# Patient Record
Sex: Female | Born: 1995 | Race: Black or African American | Hispanic: No | Marital: Single | Smoking: Never smoker
Health system: Southern US, Community
[De-identification: ages and names within clinical notes are randomized; demographics above are authoritative.]

---

## 2019-02-12 ENCOUNTER — Other Ambulatory Visit: Payer: Self-pay

## 2019-02-12 DIAGNOSIS — Z20822 Contact with and (suspected) exposure to covid-19: Secondary | ICD-10-CM

## 2019-02-13 LAB — NOVEL CORONAVIRUS, NAA: SARS-CoV-2, NAA: NOT DETECTED

## 2019-05-09 ENCOUNTER — Emergency Department
Admission: EM | Admit: 2019-05-09 | Discharge: 2019-05-09 | Disposition: A | Discharge status: Home / Self Care | Payer: Federal, State, Local not specified - PPO | Attending: Emergency Medicine | Admitting: Emergency Medicine

## 2019-05-09 ENCOUNTER — Encounter: Payer: Self-pay | Admitting: Emergency Medicine

## 2019-05-09 ENCOUNTER — Other Ambulatory Visit: Payer: Self-pay

## 2019-05-09 DIAGNOSIS — M5410 Radiculopathy, site unspecified: Secondary | ICD-10-CM | POA: Diagnosis not present

## 2019-05-09 DIAGNOSIS — M25561 Pain in right knee: Secondary | ICD-10-CM

## 2019-05-09 DIAGNOSIS — M79604 Pain in right leg: Secondary | ICD-10-CM | POA: Insufficient documentation

## 2019-05-09 DIAGNOSIS — R202 Paresthesia of skin: Secondary | ICD-10-CM | POA: Diagnosis not present

## 2019-05-09 MED ORDER — MELOXICAM 15 MG PO TABS: 15.0000 mg | ORAL_TABLET | Freq: Every day | ORAL | 2 refills | Status: AC

## 2019-05-09 NOTE — ED Notes (Signed)
See triage note, Pt states she started having numbness and tingling to right leg from knee down since 1900 today.  No heat or swelling noted.  Pt in NAD at this time.

## 2019-05-09 NOTE — ED Provider Notes (Signed)
[facility] Emergency Department Provider Note  ____________________________________________   First MD Initiated Contact with Patient 05/09/19 2215     (approximate)  I have reviewed the triage vital signs and the nursing notes.   HISTORY  Chief Complaint Leg Pain    HPI Hayley Roberts is a 24 y.o. female presents emergency department complaining of tingling in the right leg from the knee down since about 7 PM.  No known injury.  No pain in her calf.  No chest pain or shortness of breath.  States the tingling sensation happens mostly when she bears weight on the knee.    History reviewed. No pertinent past medical history.  There are no problems to display for this patient.   History reviewed. No pertinent surgical history.  Prior to Admission medications   Medication Sig Start Date End Date Taking? Authorizing Provider  meloxicam (MOBIC) 15 MG tablet Take 1 tablet (15 mg total) by mouth daily. 05/09/19 05/08/20  Faythe Ghee, PA-C    Allergies Patient has no known allergies.  History reviewed. No pertinent family history.  Social History Social History   Tobacco Use  . Smoking status: Never Smoker  . Smokeless tobacco: Never Used  Substance Use Topics  . Alcohol use: Not on file  . Drug use: Not on file    Review of Systems  Constitutional: No fever/chills Eyes: No visual changes. ENT: No sore throat. Respiratory: Denies cough Cardiovascular: Denies chest pain Gastrointestinal: Denies abdominal pain Genitourinary: Negative for dysuria. Musculoskeletal: Negative for back pain.  Positive for tingling sensation from the knee down Skin: Negative for rash. Psychiatric: no mood changes,     ____________________________________________   PHYSICAL EXAM:  VITAL SIGNS: ED Triage Vitals [05/09/19 2155]  Enc Vitals Group     BP (!) 149/91     Pulse Rate (!) 102     Resp 17     Temp      Temp src      SpO2 100 %     Weight  195 lb (88.5 kg)     Height 5\' 3"  (1.6 m)     Head Circumference      Peak Flow      Pain Score      Pain Loc      Pain Edu?      Excl. in GC?     Constitutional: Alert and oriented. Well appearing and in no acute distress. Eyes: Conjunctivae are normal.  Head: Atraumatic. Nose: No congestion/rhinnorhea. Mouth/Throat: Mucous membranes are moist.   Neck:  supple no lymphadenopathy noted Cardiovascular: Normal rate, regular rhythm. Respiratory: Normal respiratory effort.  No retractions,  GU: deferred Musculoskeletal: FROM all extremities, warm and well perfused, joint line is tender at the right knee, calf is not tender, neurovascular is intact, patient can feel soft and light touch, tingling sensation is reproduced only with standing Neurologic:  Normal speech and language.  Skin:  Skin is warm, dry and intact. No rash noted. Psychiatric: Mood and affect are normal. Speech and behavior are normal.  ____________________________________________   LABS (all labs ordered are listed, but only abnormal results are displayed)  Labs Reviewed - No data to display ____________________________________________   ____________________________________________  RADIOLOGY    ____________________________________________   PROCEDURES  Procedure(s) performed: Immobilizer   Procedures    ____________________________________________   INITIAL IMPRESSION / ASSESSMENT AND PLAN / ED COURSE  Pertinent labs & imaging results that were available during my care of the patient were  reviewed by me and considered in my medical decision making (see chart for details).   Patient is 24 year old female presents emergency department with tingling from the right knee down.  See HPI  Physical exam shows patient appear well.  Neurovascular is intact.  No calf tenderness.  Some tenderness at the joint line of the right knee.  Remainder the exam is unremarkable  Explained to the patient that she  may have some inflammation at the joint line which is causing pressure on the nerve.  This could cause a tingling sensation.  I feel a trial of a knee immobilizer and anti-inflammatory be appropriate.  I put her on light duty for work.  She is to apply ice to the knee.  Return emergency department worsening.  She is to follow-up with orthopedics if not improving in 2 to 3 days.  She states she understands and will comply.  She discharged stable condition.    Hayley Roberts was evaluated in Emergency Department on 05/09/2019 for the symptoms described in the history of present illness. She was evaluated in the context of the global COVID-19 pandemic, which necessitated consideration that the patient might be at risk for infection with the SARS-CoV-2 virus that causes COVID-19. Institutional protocols and algorithms that pertain to the evaluation of patients at risk for COVID-19 are in a state of rapid change based on information released by regulatory bodies including the CDC and federal and state organizations. These policies and algorithms were followed during the patient's care in the ED.   As part of my medical decision making, I reviewed the following data within the [facility] notes reviewed and incorporated, Old chart reviewed, Notes from prior ED visits and [place] Controlled Substance Database  ____________________________________________   FINAL CLINICAL IMPRESSION(S) / ED DIAGNOSES  Final diagnoses:  Acute pain of right knee  Radiculitis with lower extremity symptoms      NEW MEDICATIONS STARTED DURING THIS VISIT:  New Prescriptions   MELOXICAM (MOBIC) 15 MG TABLET    Take 1 tablet (15 mg total) by mouth daily.     Note:  This document was prepared using Dragon voice recognition software and may include unintentional dictation errors.    Versie Starks, PA-C 05/09/19 2248    Nance Pear, MD 05/09/19 [phone]

## 2019-05-09 NOTE — Discharge Instructions (Signed)
Follow-up with [facility] clinic orthopedics.  Please call for an appointment.  Return emergency department if there is any redness or swelling or calf pain.  Take medication as prescribed

## 2019-05-09 NOTE — ED Triage Notes (Signed)
Pt c/o tinglings and numbness to the right lower leg since 1900. Pt denies injury. No heat at sights, no swelling or redness noted.

## 2020-07-02 ENCOUNTER — Other Ambulatory Visit: Payer: Self-pay | Admitting: Physician Assistant

## 2020-07-02 DIAGNOSIS — E049 Nontoxic goiter, unspecified: Secondary | ICD-10-CM

## 2020-07-28 ENCOUNTER — Ambulatory Visit
Admission: RE | Admit: 2020-07-28 | Discharge: 2020-07-28 | Disposition: A | Discharge status: Home / Self Care | Payer: Federal, State, Local not specified - PPO | Source: Ambulatory Visit | Attending: Physician Assistant | Admitting: Physician Assistant

## 2020-07-28 ENCOUNTER — Other Ambulatory Visit: Payer: Self-pay

## 2020-07-28 DIAGNOSIS — E049 Nontoxic goiter, unspecified: Secondary | ICD-10-CM | POA: Insufficient documentation

## 2020-11-26 ENCOUNTER — Other Ambulatory Visit: Payer: Self-pay

## 2020-11-26 DIAGNOSIS — R519 Headache, unspecified: Secondary | ICD-10-CM | POA: Diagnosis not present

## 2020-11-26 DIAGNOSIS — Z5321 Procedure and treatment not carried out due to patient leaving prior to being seen by health care provider: Secondary | ICD-10-CM | POA: Insufficient documentation

## 2020-11-26 DIAGNOSIS — R202 Paresthesia of skin: Secondary | ICD-10-CM | POA: Diagnosis not present

## 2020-11-26 NOTE — ED Triage Notes (Addendum)
Pt in with co left arm numbness started 15 min pta, states from mid upper arm down to fingers. Denies any other deficits, does have a temporal headache. PT has equal hand grips and no neuro deficits noted upon exam.

## 2020-11-27 ENCOUNTER — Emergency Department
Admission: EM | Admit: 2020-11-27 | Discharge: 2020-11-27 | Discharge status: Against Medical Advice | Payer: Federal, State, Local not specified - PPO | Attending: Emergency Medicine | Admitting: Emergency Medicine

## 2022-08-13 IMAGING — US US THYROID
1 series · 14 of 25 positions shown · non-contrast
Comparison: None.

CLINICAL DATA: Goiter.  Palpable thyroid gland.

EXAM:
THYROID ULTRASOUND
TECHNIQUE: Ultrasound examination of the thyroid gland and adjacent soft
tissues was performed.

[Series 1: us thyroid · 0.07mm/px · 14 of 36 slices shown]
[im 1/36]
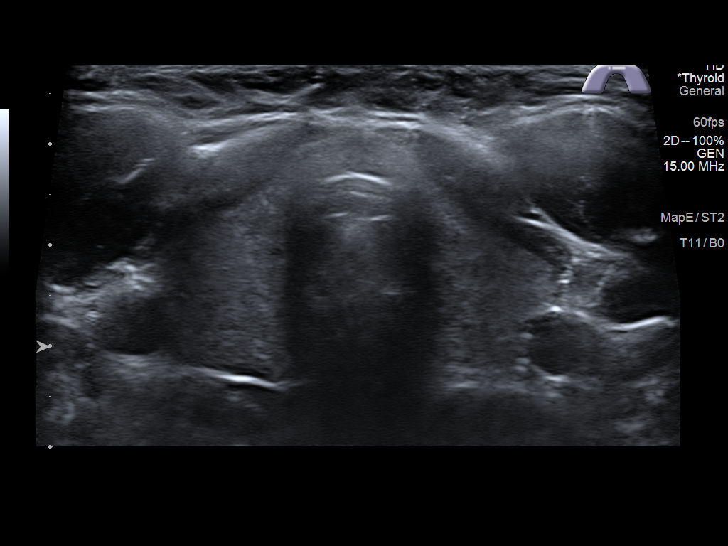
[im 3/36]
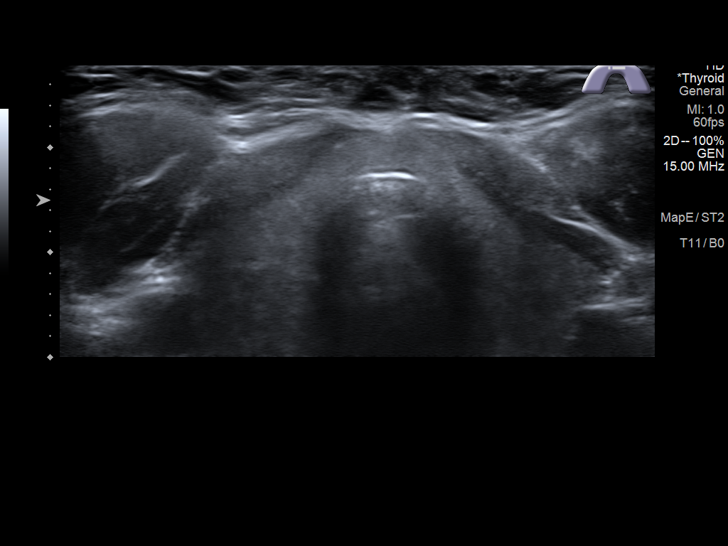
[im 6/36]
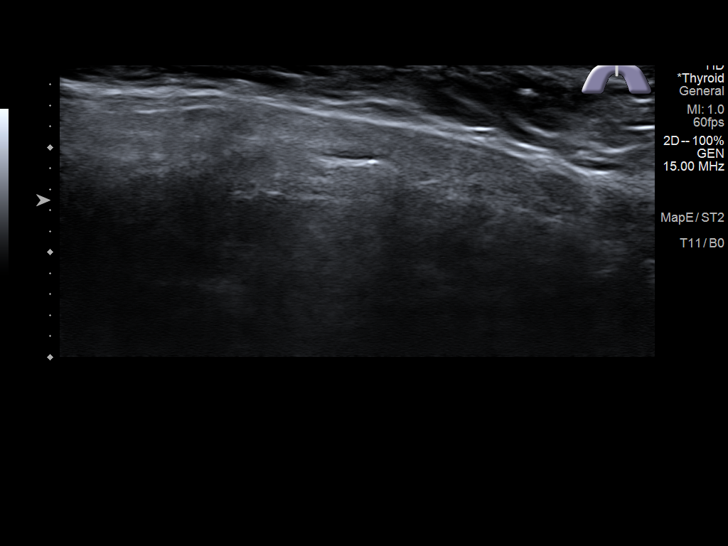
[im 9/36]
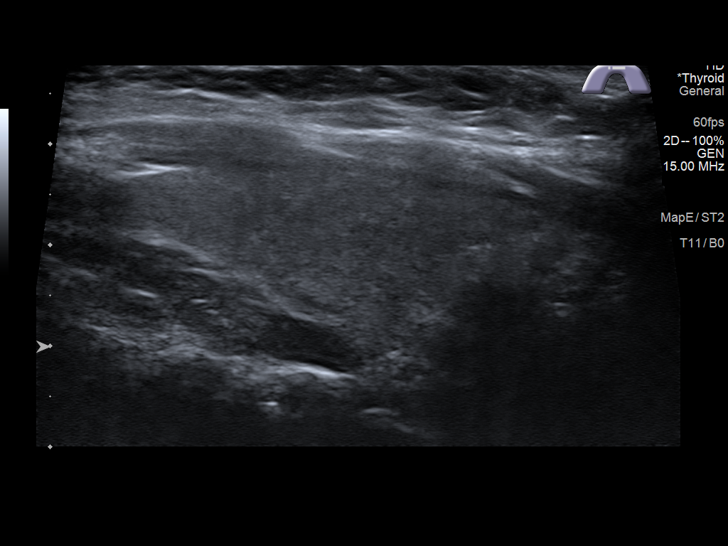
[im 12/36]
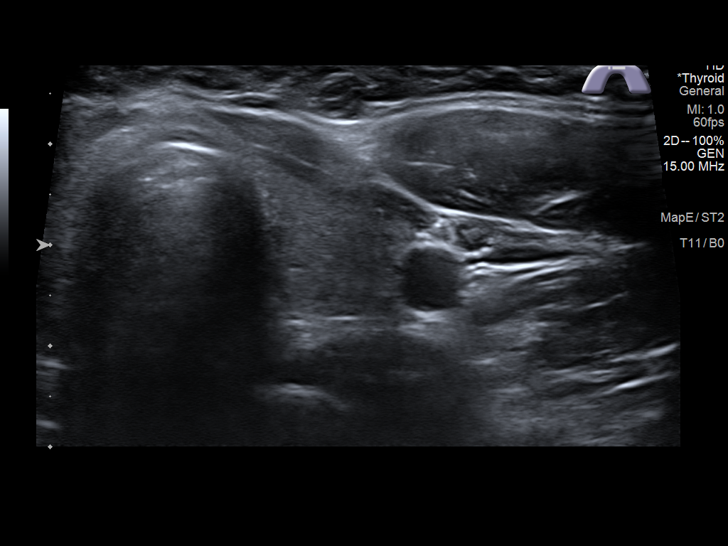
[im 14/36]
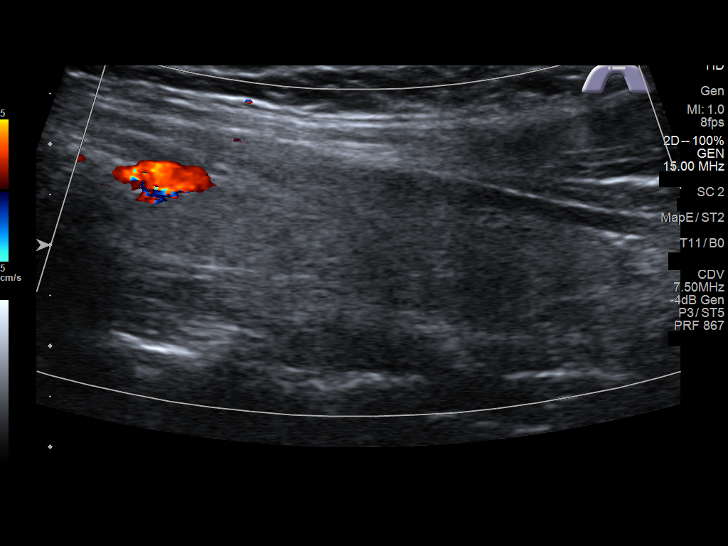
[im 17/36]
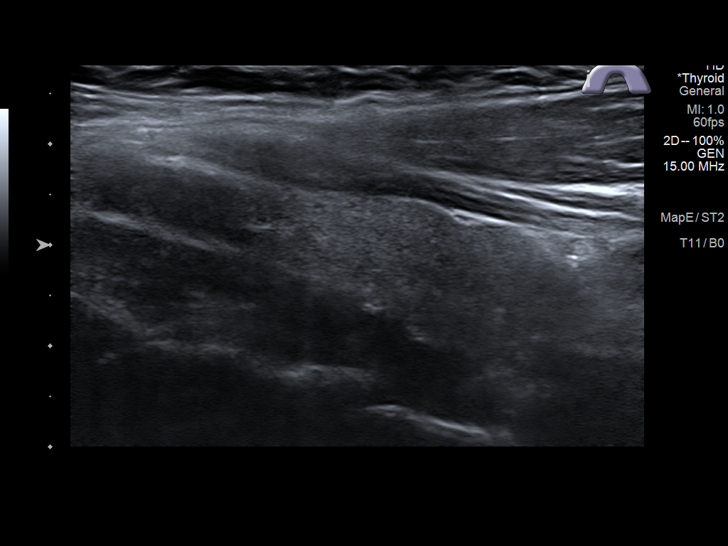
[im 19/36]
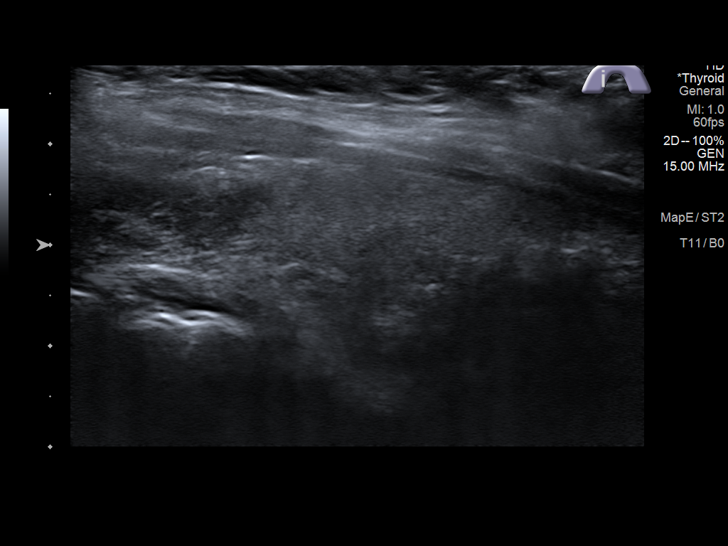
[im 22/36]
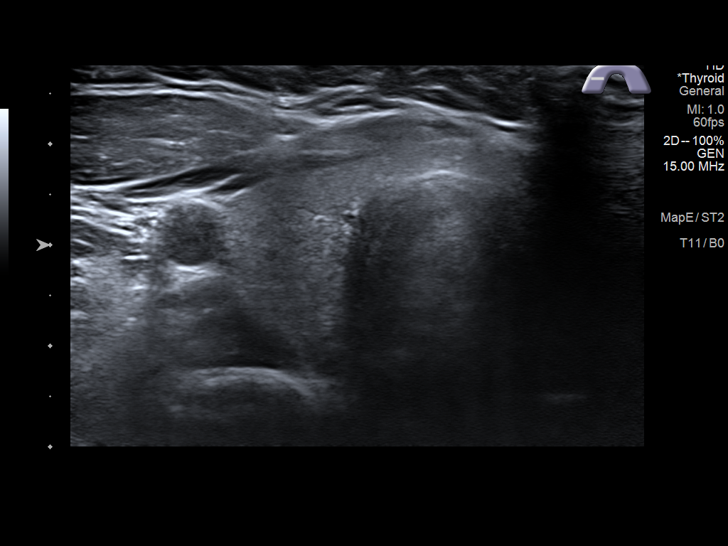
[im 24/36]
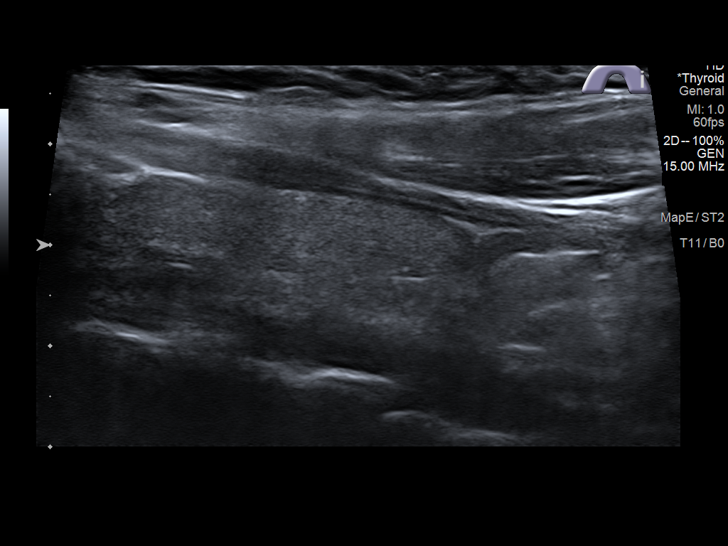
[im 27/36]
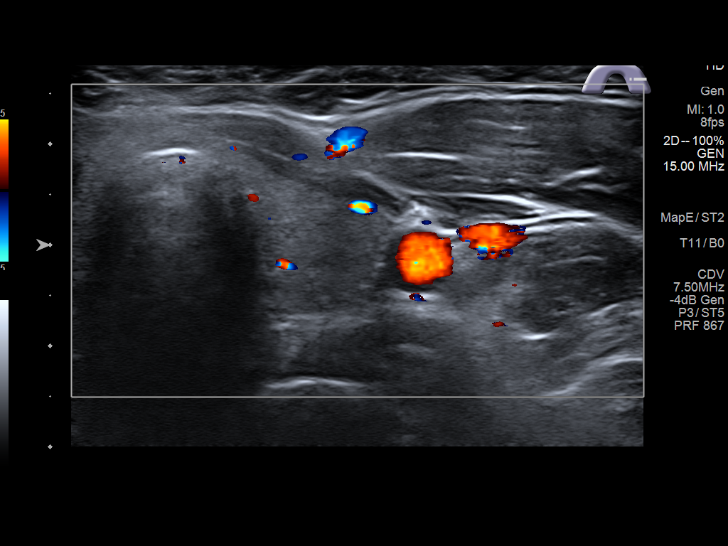
[im 30/36]
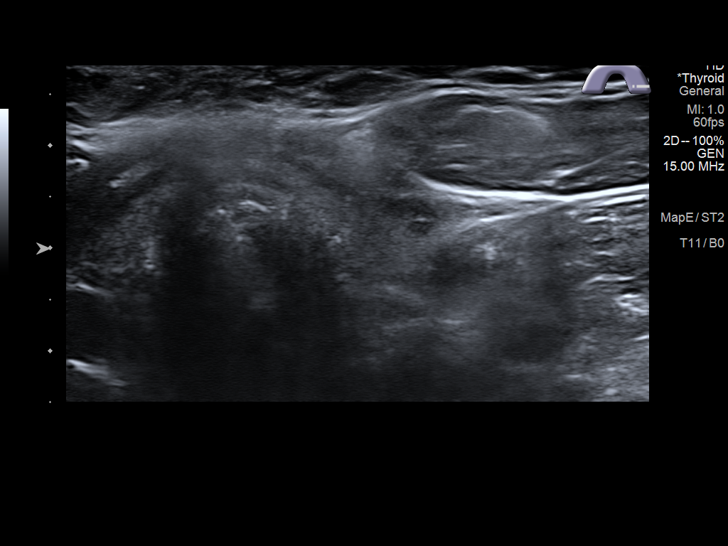
[im 33/36]
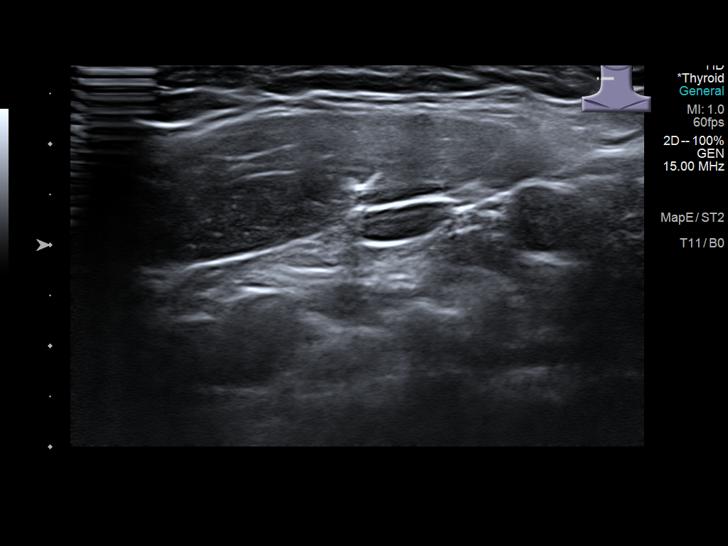
[im 36/36]
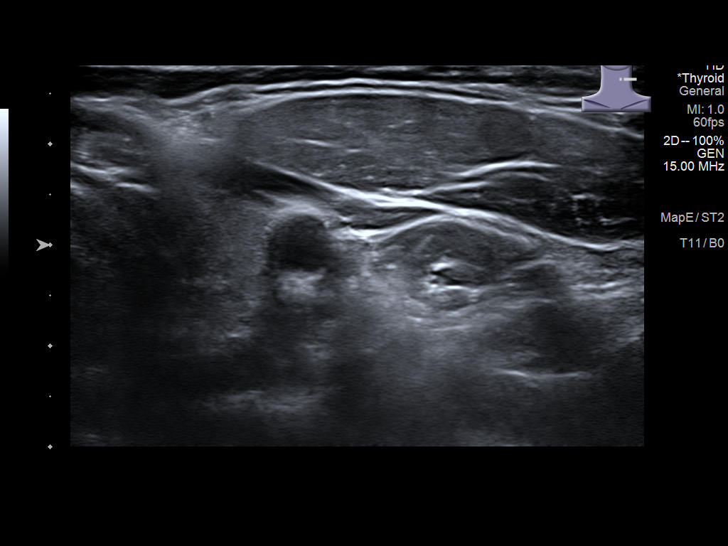

[14 of 25 positions shown; findings below may reference images not displayed]

FINDINGS: Parenchymal Echotexture: Normal

Isthmus: 0.4 cm

Right lobe: 4.3 x 1.4 x 1.5 cm

Left lobe: 5.0 x 1.1 x 1.4 cm

_________________________________________________________

Estimated total number of nodules >/= 1 cm: 0

Number of spongiform nodules >/=  2 cm not described below (TR1): 0

Number of mixed cystic and solid nodules >/= 1.5 cm not described
below (TR2): 0

_________________________________________________________

No discrete nodules are seen within the thyroid gland. No abnormal
lymph nodes identified.
IMPRESSION: Unremarkable thyroid ultrasound. No thyroid nodules. The thyroid
gland is not significantly enlarged.

The above is in keeping with the ACR TI-RADS recommendations - [HOSPITAL] 0105;[DATE].
